# Patient Record
Sex: Male | Born: 1990 | Race: Black or African American | Hispanic: No | Marital: Single | Smoking: Current every day smoker
Health system: Southern US, Community
[De-identification: ages and names within clinical notes are randomized; demographics above are authoritative.]

---

## 2019-03-19 ENCOUNTER — Emergency Department (HOSPITAL_BASED_OUTPATIENT_CLINIC_OR_DEPARTMENT_OTHER): Payer: Self-pay

## 2019-03-19 ENCOUNTER — Emergency Department (HOSPITAL_BASED_OUTPATIENT_CLINIC_OR_DEPARTMENT_OTHER)
Admission: EM | Admit: 2019-03-19 | Discharge: 2019-03-19 | Disposition: A | Discharge status: Home / Self Care | Payer: Self-pay | Attending: Emergency Medicine | Admitting: Emergency Medicine

## 2019-03-19 ENCOUNTER — Encounter (HOSPITAL_BASED_OUTPATIENT_CLINIC_OR_DEPARTMENT_OTHER): Payer: Self-pay | Admitting: Emergency Medicine

## 2019-03-19 ENCOUNTER — Other Ambulatory Visit: Payer: Self-pay

## 2019-03-19 DIAGNOSIS — F17210 Nicotine dependence, cigarettes, uncomplicated: Secondary | ICD-10-CM | POA: Insufficient documentation

## 2019-03-19 DIAGNOSIS — R058 Other specified cough: Secondary | ICD-10-CM

## 2019-03-19 DIAGNOSIS — R05 Cough: Secondary | ICD-10-CM | POA: Insufficient documentation

## 2019-03-19 DIAGNOSIS — Z20828 Contact with and (suspected) exposure to other viral communicable diseases: Secondary | ICD-10-CM | POA: Insufficient documentation

## 2019-03-19 MED ORDER — BENZONATATE 100 MG PO CAPS: 100.0000 mg | ORAL_CAPSULE | Freq: Three times a day (TID) | ORAL | 0 refills | Status: AC

## 2019-03-19 MED ORDER — ALBUTEROL SULFATE HFA 108 (90 BASE) MCG/ACT IN AERS
8.0000 | INHALATION_SPRAY | Freq: Once | RESPIRATORY_TRACT | Status: DC
  Filled 2019-03-19: qty 6.7

## 2019-03-19 MED ORDER — IPRATROPIUM BROMIDE HFA 17 MCG/ACT IN AERS
4.0000 | INHALATION_SPRAY | Freq: Once | RESPIRATORY_TRACT | Status: DC
  Filled 2019-03-19: qty 12.9

## 2019-03-19 MED FILL — BENZONATATE 100 MG CAPS: 100 | 5 days supply | Qty: 15 | Fill #0

## 2019-03-19 NOTE — Discharge Instructions (Signed)
Please read and follow all provided instructions.  Your diagnoses today include:  1. Cough with exposure to COVID-19 virus     Tests performed today include:  Chest x-ray -no sign of pneumonia  Coronavirus test -should return by tomorrow  Vital signs. See below for your results today.   Medications prescribed:   Tessalon Perles - cough suppressant medication  Take any prescribed medications only as directed.  Home care instructions:  Follow any educational materials contained in this packet.  BE VERY CAREFUL not to take multiple medicines containing Tylenol (also called acetaminophen). Doing so can lead to an overdose which can damage your liver and cause liver failure and possibly death.   Follow-up instructions: Please follow-up with your primary care provider in the next 3 days for further evaluation of your symptoms.   Return instructions:   Please return to the Emergency Department if you experience worsening symptoms.   Return to the emergency department with worsening shortness of breath, high fevers, trouble breathing, new symptoms or other concerns.  Please return if you have any other emergent concerns.  Additional Information:  Your vital signs today were: BP (!) 136/46 (BP Location: Right Arm)    Pulse (!) 57    Temp 98.9 F (37.2 C) (Oral)    Resp 20    Ht 5\' 9"  (1.753 m)    Wt 67.6 kg    SpO2 98%    BMI 22.02 kg/m  If your blood pressure (BP) was elevated above 135/85 this visit, please have this repeated by your doctor within one month. --------------

## 2019-03-19 NOTE — ED Notes (Signed)
MDI ordered in error. Was not given to patient. Medication returned. Spoke with MD and RN about entry mistake on my part.

## 2019-03-19 NOTE — ED Triage Notes (Signed)
Dry cough x3 days.  Pain in upper back with cough.

## 2019-03-19 NOTE — ED Provider Notes (Signed)
[facility] EMERGENCY DEPARTMENT Provider Note   CSN: [id number] Arrival date & time: 03/19/19  0950     History   Chief Complaint Chief Complaint  Patient presents with  . Cough    HPI Jeremy Rivers is a 28 y.o. male.     Patient presents the emergency department with complaint of back soreness and chest soreness with cough for the past 2 to 3 days.  Patient works in a place where he was exposed to COVID positive coworkers.  He denies any ear pain, runny nose, sore throat.  He has worse pain in his back with coughing.  No shortness of breath or wheezing.  No nausea, vomiting, or diarrhea.  He has not lost his sense of taste or smell.  No treatments prior to arrival.  Onset of symptoms acute.  Course is constant.     History reviewed. No pertinent past medical history.  There are no active problems to display for this patient.   History reviewed. No pertinent surgical history.      Home Medications    Prior to Admission medications   Not on File    Family History No family history on file.  Social History Social History   Tobacco Use  . Smoking status: Current Every Day Smoker    Packs/day: 1.50    Types: Cigarettes  . Smokeless tobacco: Never Used  Substance Use Topics  . Alcohol use: Never    Frequency: Never  . Drug use: Never     Allergies   Patient has no known allergies.   Review of Systems Review of Systems  Constitutional: Negative for fever.  HENT: Negative for congestion, rhinorrhea and sore throat.   Eyes: Negative for redness.  Respiratory: Positive for cough. Negative for shortness of breath and wheezing.   Cardiovascular: Negative for chest pain.  Gastrointestinal: Negative for abdominal pain, diarrhea, nausea and vomiting.  Genitourinary: Negative for dysuria.  Musculoskeletal: Positive for back pain. Negative for myalgias.  Skin: Negative for rash.  Neurological: Negative for headaches.     Physical Exam  Updated Vital Signs BP (!) 136/46 (BP Location: Right Arm)   Pulse (!) 57   Temp 98.9 F (37.2 C) (Oral)   Resp 20   Ht 5\' 9"  (1.753 m)   Wt 67.6 kg   SpO2 98%   BMI 22.02 kg/m   Physical Exam Vitals signs and nursing note reviewed.  Constitutional:      Appearance: He is well-developed.  HENT:     Head: Normocephalic and atraumatic.     Jaw: No trismus.     Right Ear: Tympanic membrane, ear canal and external ear normal.     Left Ear: Tympanic membrane, ear canal and external ear normal.     Nose: Nose normal. No mucosal edema or rhinorrhea.     Mouth/Throat:     Mouth: Mucous membranes are not dry.     Pharynx: Uvula midline. No oropharyngeal exudate, posterior oropharyngeal erythema or uvula swelling.     Tonsils: No tonsillar abscesses.  Eyes:     General:        Right eye: No discharge.        Left eye: No discharge.     Conjunctiva/sclera: Conjunctivae normal.  Neck:     Musculoskeletal: Normal range of motion and neck supple.  Cardiovascular:     Rate and Rhythm: Normal rate and regular rhythm.     Heart sounds: Normal heart sounds.  Pulmonary:  Effort: Pulmonary effort is normal. No respiratory distress.     Breath sounds: Normal breath sounds. No wheezing or rales.  Abdominal:     Palpations: Abdomen is soft.     Tenderness: There is no abdominal tenderness.  Skin:    General: Skin is warm and dry.  Neurological:     Mental Status: He is alert.      ED Treatments / Results  Labs (all labs ordered are listed, but only abnormal results are displayed) Labs Reviewed  NOVEL CORONAVIRUS, NAA (HOSP ORDER, SEND-OUT TO REF LAB; TAT 18-24 HRS)    EKG None  Radiology Dg Chest Portable 1 View  Result Date: 03/19/2019 CLINICAL DATA:  Cough EXAM: PORTABLE CHEST 1 VIEW COMPARISON:  None. FINDINGS: Lungs are clear. Heart size and pulmonary vascularity are normal. No adenopathy. No bone lesions. IMPRESSION: No edema or consolidation. Electronically Signed    By: Lowella Grip III M.D.   On: 03/19/2019 11:06    Procedures Procedures (including critical care time)  Medications Ordered in ED Medications - No data to display   Initial Impression / Assessment and Plan / ED Course  I have reviewed the triage vital signs and the nursing notes.  Pertinent labs & imaging results that were available during my care of the patient were reviewed by me and considered in my medical decision making (see chart for details).        Patient seen and examined.  Vital signs reviewed and are as follows: BP (!) 136/46 (BP Location: Right Arm)   Pulse (!) 57   Temp 98.9 F (37.2 C) (Oral)   Resp 20   Ht 5\' 9"  (1.753 m)   Wt 67.6 kg   SpO2 98%   BMI 22.02 kg/m   Patient is well-appearing.  Will check chest x-ray and send coronavirus testing given potential exposures.  Patient counseled to isolate until results are known.  Discussed need to continue isolating for 10+3 days if he were to test positive.  Discussed return to emergency department with worsening shortness of breath, persistent vomiting, new symptoms or other concerns.  12:03 PM CXR reviewed.  Patient updated on results.  He is dressed and sitting in chair waiting discharge.  Encouraged return to the emergency department for worsening trouble breathing, shortness of breath, new symptoms or other concerns.  Patient provided with a work note.    Final Clinical Impressions(s) / ED Diagnoses   Final diagnoses:  Cough with exposure to COVID-19 virus   Patient with isolated cough without fever after potential exposure to coronavirus.  Suspect viral respiratory infection.  COVID testing pending.  Patient is not hypoxic, febrile, tachycardic.  No indication for further work-up at this time.  Patient can go home with appropriate COVID precautions and return to work with negative testing or after sufficient isolation if positive.   ED Discharge Orders         Ordered    benzonatate (TESSALON)  100 MG capsule  Every 8 hours     03/19/19 1159           Carlisle Cater, PA-C 03/19/19 1205    Blanchie Dessert, MD 03/19/19 1455

## 2019-03-20 LAB — NOVEL CORONAVIRUS, NAA (HOSP ORDER, SEND-OUT TO REF LAB; TAT 18-24 HRS): SARS-CoV-2, NAA: NOT DETECTED

## 2020-02-24 IMAGING — DX DG CHEST 1V PORT
1 series · 1 of 1 positions shown · non-contrast
Comparison: None.

CLINICAL DATA: Cough

EXAM:
PORTABLE CHEST 1 VIEW

[chest ap]
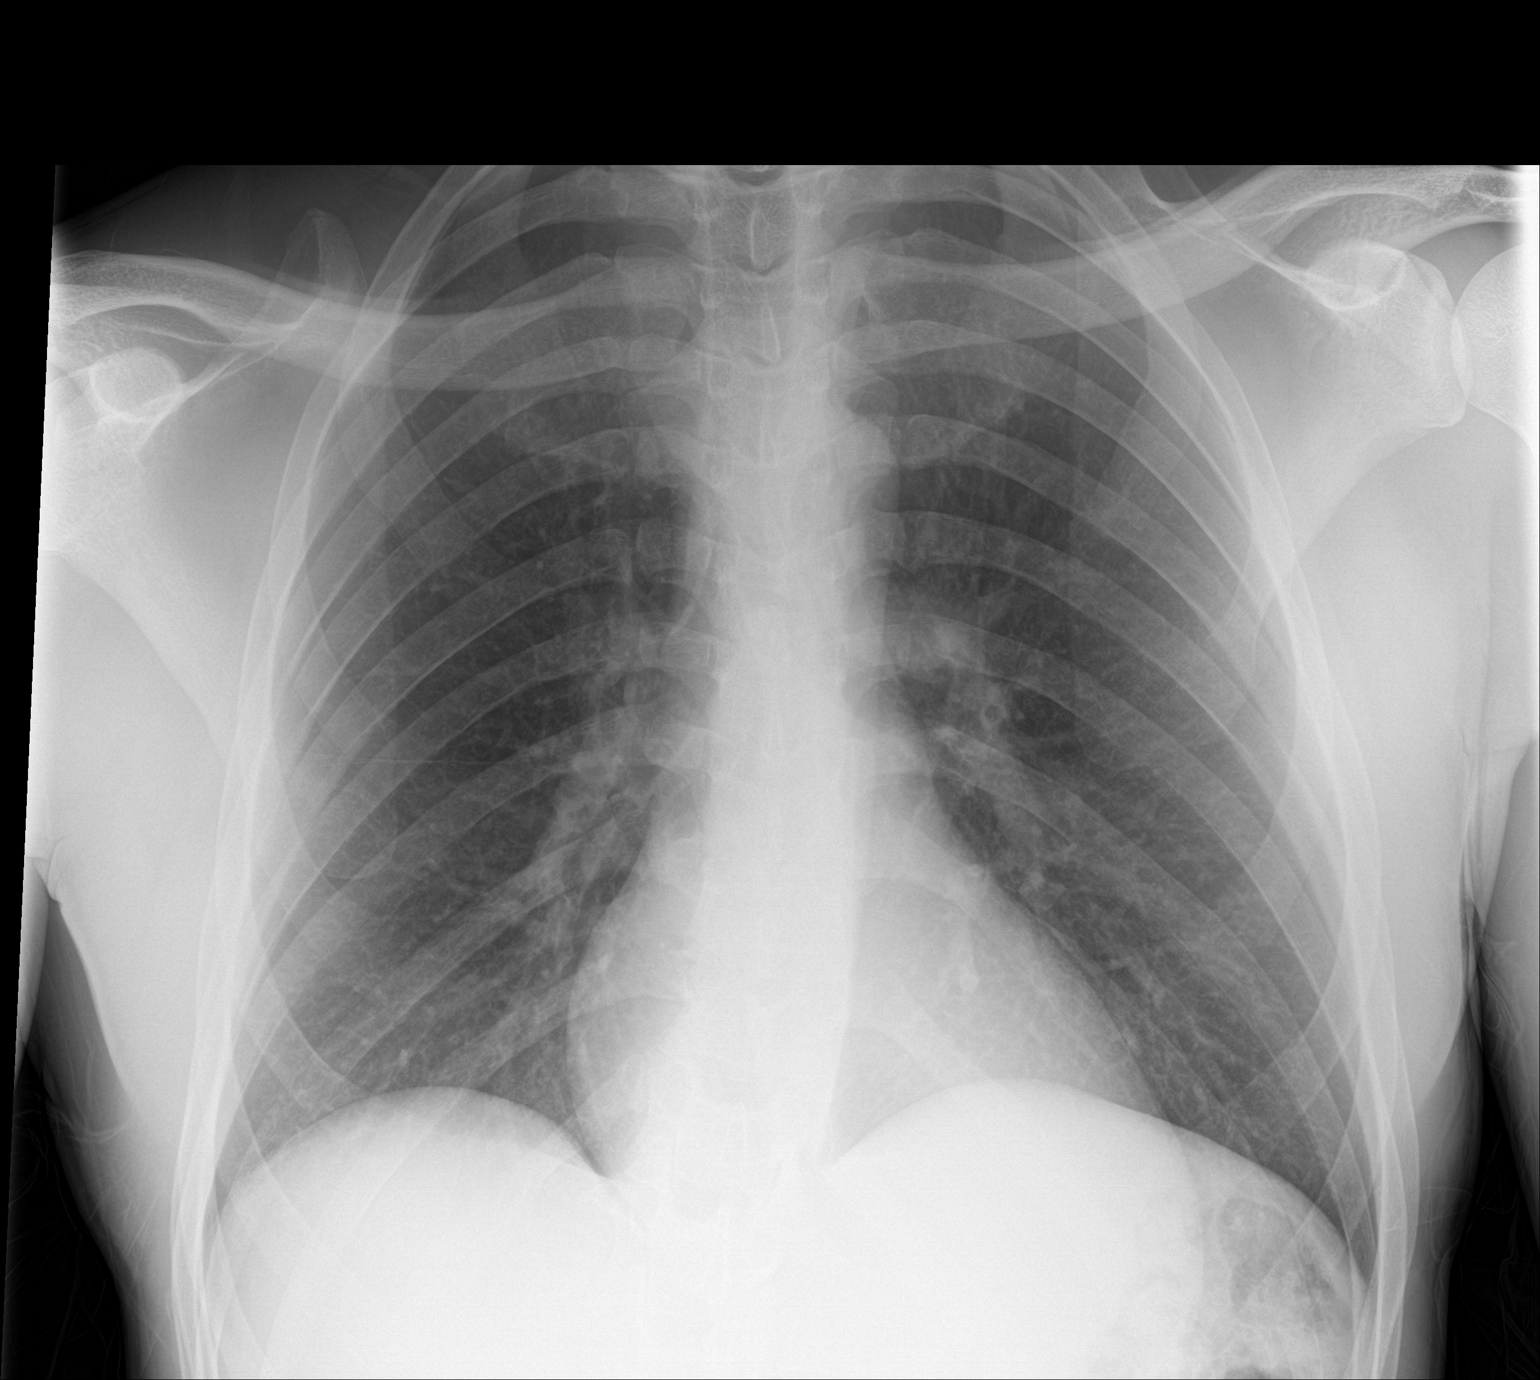

[1 of 1 positions shown; findings below may reference images not displayed]

FINDINGS: Lungs are clear. Heart size and pulmonary vascularity are normal. No
adenopathy. No bone lesions.
IMPRESSION: No edema or consolidation.
